# Patient Record
Sex: Male | Born: 1990 | Race: White | Hispanic: No | Marital: Single | Smoking: Never smoker
Health system: Southern US, Community
[De-identification: ages and names within clinical notes are randomized; demographics above are authoritative.]

## PROBLEM LIST (undated history)

## (undated) DIAGNOSIS — Z789 Other specified health status: Secondary | ICD-10-CM

## (undated) HISTORY — PX: WISDOM TOOTH EXTRACTION: SHX21

---
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
## 2015-04-25 ENCOUNTER — Other Ambulatory Visit: Payer: Self-pay

## 2015-04-25 ENCOUNTER — Ambulatory Visit: Payer: Managed Care, Other (non HMO)

## 2015-04-25 ENCOUNTER — Encounter: Payer: Self-pay | Admitting: Emergency Medicine

## 2015-04-25 ENCOUNTER — Ambulatory Visit
Admission: EM | Admit: 2015-04-25 | Discharge: 2015-04-25 | Disposition: A | Discharge status: Home / Self Care | Payer: Managed Care, Other (non HMO) | Attending: Internal Medicine | Admitting: Internal Medicine

## 2015-04-25 DIAGNOSIS — R079 Chest pain, unspecified: Secondary | ICD-10-CM | POA: Diagnosis present

## 2015-04-25 DIAGNOSIS — R0602 Shortness of breath: Secondary | ICD-10-CM | POA: Diagnosis not present

## 2015-04-25 DIAGNOSIS — R0789 Other chest pain: Secondary | ICD-10-CM | POA: Diagnosis not present

## 2015-04-25 DIAGNOSIS — M545 Low back pain: Secondary | ICD-10-CM | POA: Insufficient documentation

## 2015-04-25 MED ORDER — ALBUTEROL SULFATE HFA 108 (90 BASE) MCG/ACT IN AERS: 1.0000 | INHALATION_SPRAY | Freq: Four times a day (QID) | RESPIRATORY_TRACT | Status: AC | PRN

## 2015-04-25 MED ORDER — OMEPRAZOLE 20 MG PO CPDR: 40.0000 mg | DELAYED_RELEASE_CAPSULE | Freq: Every day | ORAL | Status: AC

## 2015-04-25 MED ORDER — IPRATROPIUM-ALBUTEROL 0.5-2.5 (3) MG/3ML IN SOLN
3.0000 mL | Freq: Once | RESPIRATORY_TRACT | Status: AC
  Administered 2015-04-25: 3 mL via RESPIRATORY_TRACT

## 2015-04-25 NOTE — Discharge Instructions (Signed)
Bronchospasm is sometimes caused by excess stomach acid.  Try medication for stomach acid for 2 weeks, to see if improvement occurs.  Go to ER if severe tightness/difficulty breathing/speaking occur. A primary care provider can help determine if additional testing/treatments are needed, if chest tightness/discomfort persist.  Bronchospasm A bronchospasm is when the tubes that carry air in and out of your lungs (airways) spasm or tighten. During a bronchospasm it is hard to breathe. This is because the airways get smaller. A bronchospasm can be triggered by:  Allergies. These may be to animals, pollen, food, or mold.  Infection. This is a common cause of bronchospasm.  Exercise.  Irritants. These include pollution, cigarette smoke, strong odors, aerosol sprays, and paint fumes.  Weather changes.  Stress.  Being emotional. HOME CARE   Always have a plan for getting help. Know when to call your doctor and local emergency services (911 in the [place] Know where you can get emergency care.  Only take medicines as told by your doctor.  If you were prescribed an inhaler or nebulizer machine, ask your doctor how to use it correctly. Always use a spacer with your inhaler if you were given one.  Stay calm during an attack. Try to relax and breathe more slowly.  Control your home environment:  Change your heating and air conditioning filter at least once a month.  Limit your use of fireplaces and wood stoves.  Do not  smoke. Do not  allow smoking in your home.  Avoid perfumes and fragrances.  Get rid of pests (such as roaches and mice) and their droppings.  Throw away plants if you see mold on them.  Keep your house clean and dust free.  Replace carpet with wood, tile, or vinyl flooring. Carpet can trap dander and dust.  Use allergy-proof pillows, mattress covers, and box spring covers.  Wash bed sheets and blankets every week in hot water. Dry them in a dryer.  Use blankets  that are made of polyester or cotton.  Wash hands frequently. GET HELP IF:  You have muscle aches.  You have chest pain.  The thick spit you spit or cough up (sputum) changes from clear or white to yellow, green, gray, or bloody.  The thick spit you spit or cough up gets thicker.  There are problems that may be related to the medicine you are given such as:  A rash.  Itching.  Swelling.  Trouble breathing. GET HELP RIGHT AWAY IF:  You feel you cannot breathe or catch your breath.  You cannot stop coughing.  Your treatment is not helping you breathe better.  You have very bad chest pain. MAKE SURE YOU:   Understand these instructions.  Will watch your condition.  Will get help right away if you are not doing well or get worse. Document Released: 09/13/2009 Document Revised: 11/21/2013 Document Reviewed: 05/09/2013 [facility] Patient Information 2015 [employer], [place]. This information is not intended to replace advice given to you by your health care provider. Make sure you discuss any questions you have with your health care provider.

## 2015-04-25 NOTE — ED Notes (Signed)
Chest pain X 3 weeks. He feels sharp pains that last about 30 seconds, but then relieves into tightness in his chest. Reports a lot of pain with running yesterday and SOB. No personal heart or lung issues.

## 2015-04-25 NOTE — ED Provider Notes (Signed)
CSN: [id number]     Arrival date & time 04/25/15  1431 History   None    Chief Complaint  Patient presents with  . Chest Pain   HPI  Chest pain X 3 weeks. He feels sharp pains that last about 30 seconds, but then relieves into tightness in his chest. Reports a lot of pain with running yesterday and SOB. No personal heart or lung issues. Non smoker. No leg pain/swelling, not on hormones, no known family hx DVT. 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  Mild intermittent nausea, about 3x/week, denies frank heartburn/indigestion.  Has the sharp chest pains 2-3x/day, then underlying chest tightness most of the time.  No cough, no fever.  No wheezing, no hx asthma. No joint pains, no rash, no weight loss.  Little bit of low back pain.     History reviewed. No pertinent past medical history. Past Surgical History  Procedure Laterality Date  . Wisdom tooth extraction     No family history on file. History  Substance Use Topics  . Smoking status: Never Smoker   . Smokeless tobacco: Current User  . Alcohol Use: Yes     Comment: Rare- 1 drink/month    Review of Systems  All other systems reviewed and are negative.   Allergies  Review of patient's allergies indicates no known allergies.  Home Medications   Prior to Admission medications   Not on File   BP 110/74 mmHg  Pulse 85  Temp(Src) 98.6 F (37 C) (Oral)  Resp 16  Ht  (1.778 m)  Wt 144 lb (65.318 kg)  BMI 20.66 kg/m2  SpO2 99% Physical Exam  Constitutional: He is oriented to person, place, and time. No distress.  Alert, nicely groomed  HENT:  Head: Atraumatic.  Eyes:  Conjugate gaze, no eye redness/drainage  Neck: Neck supple.  Cardiovascular: Normal rate and regular rhythm.  Exam reveals no gallop.   No murmur heard. Pulmonary/Chest: No respiratory distress. He has no wheezes. He has no rales. He exhibits no tenderness.  Lungs clear, symmetric quiet breath sounds  Abdominal: Soft.  He exhibits no distension. There is no tenderness. There is no rebound and no guarding.  Musculoskeletal: Normal range of motion.  Neurological: He is alert and oriented to person, place, and time.  Skin: Skin is warm and dry.  No cyanosis  Nursing note and vitals reviewed.   ED Course  Procedures (including critical care time) Labs Review Labs Reviewed - No data to display    ED ECG REPORT   Date: 04/25/2015  EKG Time: 4:42 PM  Rate: 85      Rhythm: normal EKG, normal sinus rhythm Axis: 90, unremarkable  No acute ST or T wave changes. I agree with computer interpretation of NSR, but find that "rightward axis" is nonsignificant.    Imaging Review Dg Chest 2 View  04/25/2015   CLINICAL DATA:  Chest pain for 3 weeks, short of breath with exercise  EXAM: CHEST  2 VIEW  COMPARISON:  Chest x-ray of 06/28/2007  FINDINGS: The lungs are clear and minimally hyperaerated. Mediastinal and hilar contours are unremarkable. The heart is within normal limits in size. No bony abnormality is seen.  IMPRESSION: Slight hyper aeration.  No active lung disease.   Electronically Signed   By: Dwyane Dee M.D.   On: 04/25/2015 16:10    Pt given duoneb breathing tx in ED for chest tightness, with improvement.    MDM  1. Chest tightness   Suspect bronchospasm due to new exercise (running), possible contribution from mild reflux. Trial of prilosec x 2 wks, prn albuterol inhaler. Recheck or FU with a PCP (given Dr Plonk's office number) for persistent sx's, if not improving in a week or two.       Eustace MooreLaura W Dannisha Eckmann, MD 04/25/15 [phone]

## 2016-05-30 IMAGING — CR DG CHEST 2V
2 series · 2 of 2 positions shown · non-contrast
Comparison: Chest x-ray of 06/28/2007

CLINICAL DATA: Chest pain for 3 weeks, short of breath with
exercise

EXAM:
CHEST  2 VIEW

[chest pa]
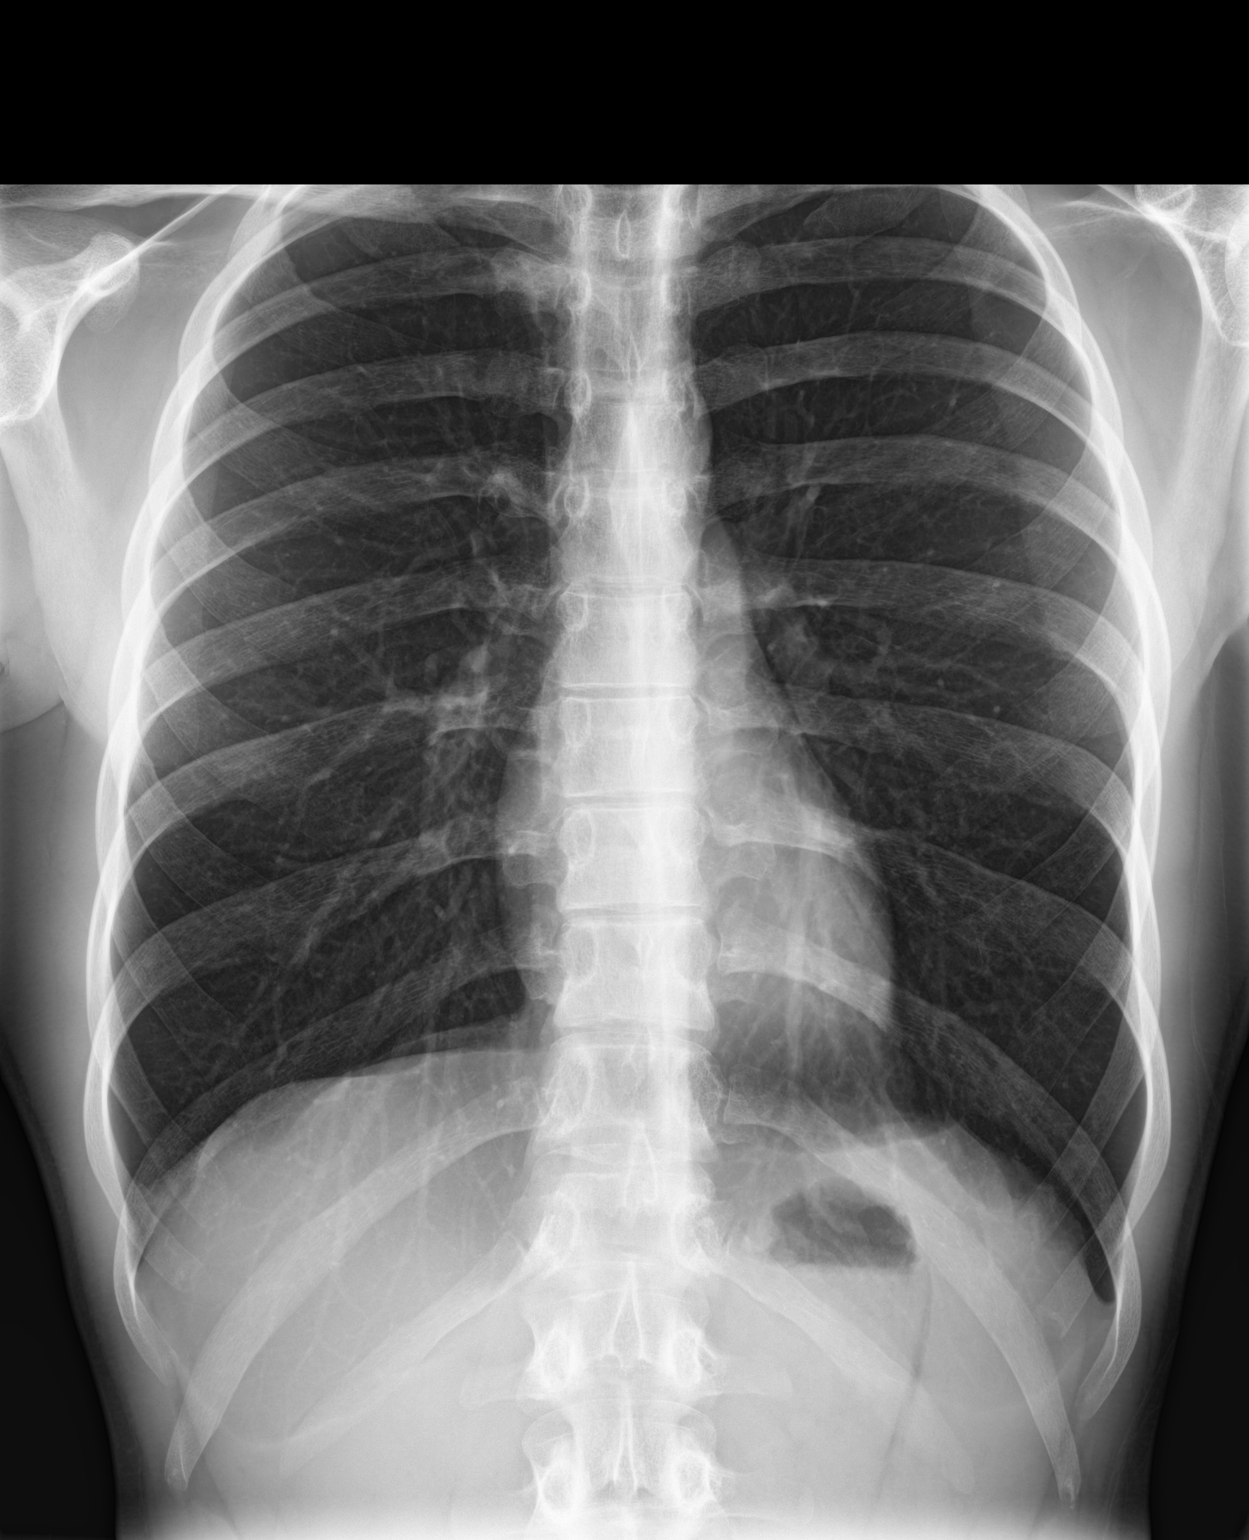

[chest lat]
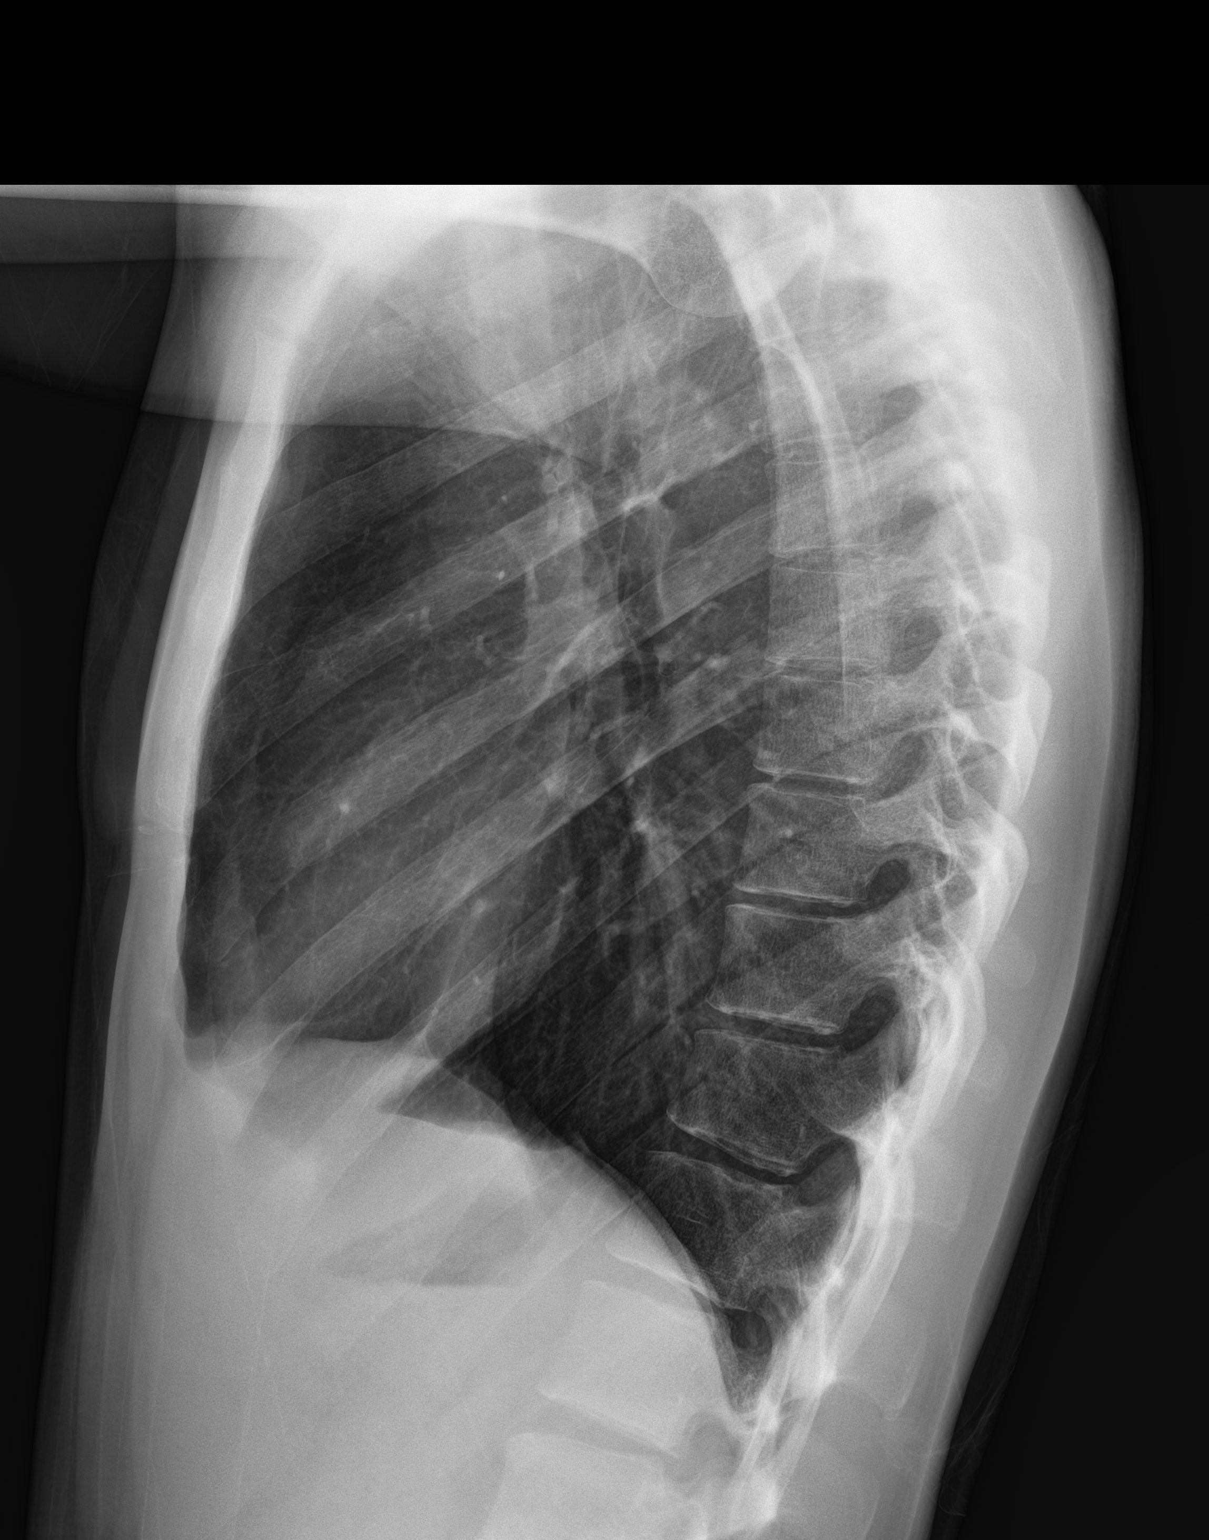

[2 of 2 positions shown; findings below may reference images not displayed]

FINDINGS: The lungs are clear and minimally hyperaerated. Mediastinal and
hilar contours are unremarkable. The heart is within normal limits
in size. No bony abnormality is seen.
IMPRESSION: Slight hyper aeration.  No active lung disease.

## 2016-07-21 NOTE — Discharge Instructions (Signed)
[facility] °[facility] °ENDOSCOPIC SINUS SURGERY °[facility] ° °What is Functional Endoscopic Sinus Surgery? ° The Surgery involves making the natural openings of the sinuses larger by removing the bony partitions that separate the sinuses from the nasal cavity.  The natural sinus lining is preserved as much as possible to allow the sinuses to resume normal function after the surgery.  In some patients nasal polyps (excessively swollen lining of the sinuses) may be removed to relieve obstruction of the sinus openings.  The surgery is performed through the nose using lighted scopes, which eliminates the need for incisions on the face.  A septoplasty is a different procedure which is sometimes performed with sinus surgery.  It involves straightening the boy partition that separates the two sides of your nose.  A crooked or deviated septum may need repair if is obstructing the sinuses or nasal airflow.  Turbinate reduction is also often performed during sinus surgery.  The turbinates are bony proturberances from the side walls of the nose which swell and can obstruct the nose in patients with sinus and allergy problems.  Their size can be surgically reduced to help relieve nasal obstruction. ° °What Can Sinus Surgery Do For Me? ° Sinus surgery can reduce the frequency of sinus infections requiring antibiotic treatment.  This can provide improvement in nasal congestion, post-nasal drainage, facial pressure and nasal obstruction.  Surgery will NOT prevent you from ever having an infection again, so it usually only for patients who get infections 4 or more times yearly requiring antibiotics, or for infections that do not clear with antibiotics.  It will not cure nasal allergies, so patients with allergies may still require medication to treat their allergies after surgery. Surgery may improve headaches related to sinusitis, however, some people will continue to  require medication to control sinus headaches related to allergies.  Surgery will do nothing for other forms of headache (migraine, tension or cluster). ° °What Are the Risks of Endoscopic Sinus Surgery? ° Current techniques allow surgery to be performed safely with little risk, however, there are rare complications that patients should be aware of.  Because the sinuses are located around the eyes, there is risk of eye injury, including blindness, though again, this would be quite rare. This is usually a result of bleeding behind the eye during surgery, which puts the vision oat risk, though there are treatments to protect the vision and prevent permanent disrupted by surgery causing a leak of the spinal fluid that surrounds the brain.  More serious complications would include bleeding inside the brain cavity or damage to the brain.  Again, all of these complications are uncommon, and spinal fluid leaks can be safely managed surgically if they occur.  The most common complication of sinus surgery is bleeding from the nose, which may require packing or cauterization of the nose.  Continued sinus have polyps may experience recurrence of the polyps requiring revision surgery.  Alterations of sense of smell or injury to the tear ducts are also rare complications.  ° °What is the Surgery Like, and what is the Recovery? ° The Surgery usually takes a couple of hours to perform, and is usually performed under a general anesthetic (completely asleep).  Patients are usually discharged home after a couple of hours.  Sometimes during surgery it is necessary to pack the nose to control bleeding, and the packing is left in place for 24 - 48 hours, and removed by your surgeon.    If a septoplasty was performed during the procedure, there is often a splint placed which must be removed after 5-7 days.   °Discomfort: Pain is usually mild to moderate, and can be controlled by prescription pain medication or acetaminophen (Tylenol).   Aspirin, Ibuprofen (Advil, Motrin), or Naprosyn (Aleve) should be avoided, as they can cause increased bleeding.  Most patients feel sinus pressure like they have a bad head cold for several days.  Sleeping with your head elevated can help reduce swelling and facial pressure, as can ice packs over the face.  A humidifier may be helpful to keep the mucous and blood from drying in the nose.  ° °Diet: There are no specific diet restrictions, however, you should generally start with clear liquids and a light diet of bland foods because the anesthetic can cause some nausea.  Advance your diet depending on how your stomach feels.  Taking your pain medication with food will often help reduce stomach upset which pain medications can cause. ° °Nasal Saline Irrigation: It is important to remove blood clots and dried mucous from the nose as it is healing.  This is done by having you irrigate the nose at least 3 - 4 times daily with a salt water solution.  We recommend using NeilMed Sinus Rinse (available at the drug store).  Fill the squeeze bottle with the solution, bend over a sink, and insert the tip of the squeeze bottle into the nose ½ of an inch.  Point the tip of the squeeze bottle towards the inside corner of the eye on the same side your irrigating.  Squeeze the bottle and gently irrigate the nose.  If you bend forward as you do this, most of the fluid will flow back out of the nose, instead of down your throat.   The solution should be warm, near body temperature, when you irrigate.   Each time you irrigate, you should use a full squeeze bottle.  ° °Note that if you are instructed to use Nasal Steroid Sprays at any time after your surgery, irrigate with saline BEFORE using the steroid spray, so you do not wash it all out of the nose. °Another product, Nasal Saline Gel (such as AYR Nasal Saline Gel) can be applied in each nostril 3 - 4 times daily to moisture the nose and reduce scabbing or crusting. ° °Bleeding:   Bloody drainage from the nose can be expected for several days, and patients are instructed to irrigate their nose frequently with salt water to help remove mucous and blood clots.  The drainage may be dark red or brown, though some fresh blood may be seen intermittently, especially after irrigation.  Do not blow you nose, as bleeding may occur. If you must sneeze, keep your mouth open to allow air to escape through your mouth. ° °If heavy bleeding occurs: Irrigate the nose with saline to rinse out clots, then spray the nose 3 - 4 times with Afrin Nasal Decongestant Spray.  The spray will constrict the blood vessels to slow bleeding.  Pinch the lower half of your nose shut to apply pressure, and lay down with your head elevated.  Ice packs over the nose may help as well. If bleeding persists despite these measures, you should notify your doctor.  Do not use the Afrin routinely to control nasal congestion after surgery, as it can result in worsening congestion and may affect healing.  ° ° ° °Activity: Return to work varies among patients. Most patients will be   out of work at least 5 - 7 days to recover.  Patient may return to work after they are off of narcotic pain medication, and feeling well enough to perform the functions of their job.  Patients must avoid heavy lifting (over 10 pounds) or strenuous physical for 2 weeks after surgery, so your employer may need to assign you to light duty, or keep you out of work longer if light duty is not possible.  NOTE: you should not drive, operate dangerous machinery, do any mentally demanding tasks or make any important legal or financial decisions while on narcotic pain medication and recovering from the general anesthetic.  °  °Call Your Doctor Immediately if You Have Any of the Following: °1. Bleeding that you cannot control with the above measures °2. Loss of vision, double vision, bulging of the eye or black eyes. °3. Fever over 101 degrees °4. Neck stiffness with  severe headache, fever, nausea and change in mental state. °You are always encourage to call anytime with concerns, however, please call with requests for pain medication refills during office hours. ° °Office Endoscopy: During follow-up visits your doctor will remove any packing or splints that may have been placed and evaluate and clean your sinuses endoscopically.  Topical anesthetic will be used to make this as comfortable as possible, though you may want to take your pain medication prior to the visit.  How often this will need to be done varies from patient to patient.  After complete recovery from the surgery, you may need follow-up endoscopy from time to time, particularly if there is concern of recurrent infection or nasal polyps. ° °General Anesthesia, Adult, Care After °Refer to this sheet in the next few weeks. These instructions provide you with information on caring for yourself after your procedure. Your health care provider may also give you more specific instructions. Your treatment has been planned according to current medical practices, but problems sometimes occur. Call your health care provider if you have any problems or questions after your procedure. °WHAT TO EXPECT AFTER THE PROCEDURE °After the procedure, it is typical to experience: °· Sleepiness. °· Nausea and vomiting. °HOME CARE INSTRUCTIONS °· For the first 24 hours after general anesthesia: °¨ Have a responsible person with you. °¨ Do not drive a car. If you are alone, do not take public transportation. °¨ Do not drink alcohol. °¨ Do not take medicine that has not been prescribed by your health care provider. °¨ Do not sign important papers or make important decisions. °¨ You may resume a normal diet and activities as directed by your health care provider. °· Change bandages (dressings) as directed. °· If you have questions or problems that seem related to general anesthesia, call the hospital and ask for the anesthetist or  anesthesiologist on call. °SEEK MEDICAL CARE IF: °· You have nausea and vomiting that continue the day after anesthesia. °· You develop a rash. °SEEK IMMEDIATE MEDICAL CARE IF:  °· You have difficulty breathing. °· You have chest pain. °· You have any allergic problems. °  °This information is not intended to replace advice given to you by your health care provider. Make sure you discuss any questions you have with your health care provider. °  °Document Released: 02/22/2001 Document Revised: 12/07/2014 Document Reviewed: 03/16/2012 °Elsevier Interactive Patient Education ©2016 [employer] ° °

## 2016-07-23 ENCOUNTER — Encounter
Admission: RE | Disposition: A | Discharge status: Home / Self Care | Payer: Self-pay | Source: Ambulatory Visit | Attending: Otolaryngology

## 2016-07-23 ENCOUNTER — Ambulatory Visit: Payer: Managed Care, Other (non HMO) | Admitting: Anesthesiology

## 2016-07-23 ENCOUNTER — Ambulatory Visit
Admission: RE | Admit: 2016-07-23 | Discharge: 2016-07-23 | Disposition: A | Discharge status: Home / Self Care | Payer: Managed Care, Other (non HMO) | Source: Ambulatory Visit | Attending: Otolaryngology | Admitting: Otolaryngology

## 2016-07-23 DIAGNOSIS — Z8349 Family history of other endocrine, nutritional and metabolic diseases: Secondary | ICD-10-CM | POA: Diagnosis not present

## 2016-07-23 DIAGNOSIS — J342 Deviated nasal septum: Secondary | ICD-10-CM | POA: Insufficient documentation

## 2016-07-23 DIAGNOSIS — J343 Hypertrophy of nasal turbinates: Secondary | ICD-10-CM | POA: Insufficient documentation

## 2016-07-23 DIAGNOSIS — J3489 Other specified disorders of nose and nasal sinuses: Secondary | ICD-10-CM | POA: Insufficient documentation

## 2016-07-23 DIAGNOSIS — Z9889 Other specified postprocedural states: Secondary | ICD-10-CM | POA: Insufficient documentation

## 2016-07-23 HISTORY — DX: Other specified health status: Z78.9

## 2016-07-23 HISTORY — PX: NASAL TURBINATE REDUCTION: SHX2072

## 2016-07-23 HISTORY — PX: SEPTOPLASTY: SHX2393

## 2016-07-23 SURGERY — SEPTOPLASTY, NOSE
Anesthesia: General | Laterality: Bilateral | Wound class: Clean Contaminated

## 2016-07-23 MED ORDER — DEXAMETHASONE SODIUM PHOSPHATE 4 MG/ML IJ SOLN
INTRAMUSCULAR | Status: DC | PRN
  Administered 2016-07-23: 10 mg via INTRAVENOUS

## 2016-07-23 MED ORDER — LIDOCAINE-EPINEPHRINE 1 %-1:100000 IJ SOLN
INTRAMUSCULAR | Status: DC | PRN
  Administered 2016-07-23: 7.5 mL

## 2016-07-23 MED ORDER — GLYCOPYRROLATE 0.2 MG/ML IJ SOLN
INTRAMUSCULAR | Status: DC | PRN
  Administered 2016-07-23: 0.6 mg via INTRAVENOUS

## 2016-07-23 MED ORDER — OXYCODONE HCL 5 MG PO TABS: 5.0000 mg | ORAL_TABLET | Freq: Once | ORAL | Status: DC | PRN

## 2016-07-23 MED ORDER — NEOSTIGMINE METHYLSULFATE 10 MG/10ML IV SOLN
INTRAVENOUS | Status: DC | PRN
  Administered 2016-07-23: 3 mg via INTRAVENOUS

## 2016-07-23 MED ORDER — PROPOFOL 10 MG/ML IV BOLUS
INTRAVENOUS | Status: DC | PRN
  Administered 2016-07-23: 50 mg via INTRAVENOUS

## 2016-07-23 MED ORDER — PROMETHAZINE HCL 25 MG/ML IJ SOLN: 6.2500 mg | INTRAMUSCULAR | Status: DC | PRN

## 2016-07-23 MED ORDER — SUCCINYLCHOLINE CHLORIDE 20 MG/ML IJ SOLN
INTRAMUSCULAR | Status: DC | PRN
  Administered 2016-07-23: 100 mg via INTRAVENOUS

## 2016-07-23 MED ORDER — CEFAZOLIN SODIUM-DEXTROSE 2-4 GM/100ML-% IV SOLN
2.0000 g | Freq: Once | INTRAVENOUS | Status: AC
  Administered 2016-07-23: 2 g via INTRAVENOUS

## 2016-07-23 MED ORDER — OXYCODONE HCL 5 MG/5ML PO SOLN: 5.0000 mg | Freq: Once | ORAL | Status: DC | PRN

## 2016-07-23 MED ORDER — ACETAMINOPHEN 10 MG/ML IV SOLN
INTRAVENOUS | Status: DC | PRN
  Administered 2016-07-23: 1000 mg via INTRAVENOUS

## 2016-07-23 MED ORDER — FENTANYL CITRATE (PF) 100 MCG/2ML IJ SOLN
25.0000 ug | INTRAMUSCULAR | Status: DC | PRN
  Administered 2016-07-23: 25 ug via INTRAVENOUS

## 2016-07-23 MED ORDER — FENTANYL CITRATE (PF) 100 MCG/2ML IJ SOLN
INTRAMUSCULAR | Status: DC | PRN
Start: 2016-07-23 — End: 2016-07-23
  Administered 2016-07-23: 25 ug via INTRAVENOUS

## 2016-07-23 MED ORDER — LACTATED RINGERS IV SOLN
INTRAVENOUS | Status: DC | PRN
  Administered 2016-07-23: 07:00:00 via INTRAVENOUS

## 2016-07-23 MED ORDER — PHENYLEPHRINE HCL 0.5 % NA SOLN
NASAL | Status: DC | PRN
  Administered 2016-07-23: 10 mL via TOPICAL

## 2016-07-23 MED ORDER — MIDAZOLAM HCL 5 MG/5ML IJ SOLN
INTRAMUSCULAR | Status: DC | PRN
  Administered 2016-07-23: 2 mg via INTRAVENOUS

## 2016-07-23 MED ORDER — ONDANSETRON HCL 4 MG/2ML IJ SOLN
INTRAMUSCULAR | Status: DC | PRN
  Administered 2016-07-23: 4 mg via INTRAVENOUS

## 2016-07-23 MED ORDER — ROCURONIUM BROMIDE 100 MG/10ML IV SOLN
INTRAVENOUS | Status: DC | PRN
  Administered 2016-07-23: 30 mg via INTRAVENOUS

## 2016-07-23 MED ORDER — OXYMETAZOLINE HCL 0.05 % NA SOLN
2.0000 | Freq: Two times a day (BID) | NASAL | Status: DC
  Administered 2016-07-23: 2 via NASAL

## 2016-07-23 MED ORDER — LIDOCAINE HCL (CARDIAC) 20 MG/ML IV SOLN
INTRAVENOUS | Status: DC | PRN
  Administered 2016-07-23: 40 mg via INTRAVENOUS

## 2016-07-23 SURGICAL SUPPLY — 42 items
AQUAPLAST 3X3 FLAT (MISCELLANEOUS) ×3
BLADE SURG 15 STRL LF DISP TIS (BLADE) IMPLANT
BLADE SURG 15 STRL SS (BLADE)
CANISTER SUCT 1200ML W/VALVE (MISCELLANEOUS) ×3 IMPLANT
CATH IV 18X1 1/4 SAFELET (CATHETERS) IMPLANT
CLOSURE WOUND 1/2 X4 (GAUZE/BANDAGES/DRESSINGS) ×1
CLOSURE WOUND 1/4X4 (GAUZE/BANDAGES/DRESSINGS) ×1
COAG SUCT 10F 3.5MM HAND CTRL (MISCELLANEOUS) IMPLANT
COAGULATOR SUCT 8FR VV (MISCELLANEOUS) ×3 IMPLANT
DRAPE HEAD BAR (DRAPES) ×3 IMPLANT
DRESSING NASL FOAM PST OP SINU (MISCELLANEOUS) IMPLANT
DRSG NASAL FOAM POST OP SINU (MISCELLANEOUS)
GLOVE PI ULTRA LF STRL 7.5 (GLOVE) ×2 IMPLANT
GLOVE PI ULTRA NON LATEX 7.5 (GLOVE) ×4
IV CATH 18X1 1/4 SAFELET (CATHETERS)
KIT ROOM TURNOVER OR (KITS) ×3 IMPLANT
NEEDLE HYPO 25GX1X1/2 BEV (NEEDLE) ×3 IMPLANT
NEEDLE SPNL 25GX3.5 QUINCKE BL (NEEDLE) IMPLANT
NS IRRIG 500ML POUR BTL (IV SOLUTION) ×3 IMPLANT
PACK DRAPE NASAL/ENT (PACKS) ×3 IMPLANT
PACKING NASAL EPIS 4X2.4 XEROG (MISCELLANEOUS) IMPLANT
PAD GROUND ADULT SPLIT (MISCELLANEOUS) ×3 IMPLANT
PATTIES SURGICAL .5 X3 (DISPOSABLE) ×3 IMPLANT
SOL ANTI-FOG 6CC FOG-OUT (MISCELLANEOUS) ×1 IMPLANT
SOL FOG-OUT ANTI-FOG 6CC (MISCELLANEOUS) ×2
SPLINT AQUAPLAST 3X3 FLAT (MISCELLANEOUS) ×1 IMPLANT
SPLINT NASAL SEPTAL BLV .50 ST (MISCELLANEOUS) ×3 IMPLANT
STRAP BODY AND KNEE 60X3 (MISCELLANEOUS) ×6 IMPLANT
STRIP CLOSURE SKIN 1/2X4 (GAUZE/BANDAGES/DRESSINGS) ×2 IMPLANT
STRIP CLOSURE SKIN 1/4X4 (GAUZE/BANDAGES/DRESSINGS) ×2 IMPLANT
SUT CHROMIC 3-0 (SUTURE) ×2
SUT CHROMIC 3-0 KS 27XMFL CR (SUTURE) ×1
SUT CHROMIC 5 0 P 3 (SUTURE) ×3 IMPLANT
SUT ETHILON 3-0 KS 30 BLK (SUTURE) ×3 IMPLANT
SUT ETHILON 4-0 (SUTURE)
SUT ETHILON 4-0 FS2 18XMFL BLK (SUTURE)
SUT PLAIN GUT 4-0 (SUTURE) ×3 IMPLANT
SUTURE CHRMC 3-0 KS 27XMFL CR (SUTURE) ×1 IMPLANT
SUTURE ETHLN 4-0 FS2 18XMF BLK (SUTURE) IMPLANT
SYR 3ML LL SCALE MARK (SYRINGE) ×3 IMPLANT
TOWEL OR 17X26 4PK STRL BLUE (TOWEL DISPOSABLE) ×3 IMPLANT
WATER STERILE IRR 500ML POUR (IV SOLUTION) ×3 IMPLANT

## 2016-07-23 NOTE — H&P (Signed)
  H&P has been reviewed and no changes necessary. To be downloaded later. 

## 2016-07-23 NOTE — Progress Notes (Signed)
Pt exhibited high sensitivity to 2 mg of Midazolam as premedication.  Very drowsy upon entering the OR.  This was the only medication given at that point.  Surgery took place uneventfully.  Pt required lower doses of fentanyl during surgery and in PACU.  Pt is now awake, smiling and has minimal pain.  Pt's fiance was told that he had significant drowsiness from the premedication.  Pt is appropriate for discharge from ambulatory center.

## 2016-07-23 NOTE — Anesthesia Preprocedure Evaluation (Signed)
Anesthesia Evaluation  Patient identified by MRN, date of birth, ID band Patient awake    Reviewed: Allergy & Precautions, NPO status , Patient's Chart, lab work & pertinent test results  Airway Mallampati: II  TM Distance: >3 FB Neck ROM: Full    Dental no notable dental hx.    Pulmonary neg pulmonary ROS,    Pulmonary exam normal breath sounds clear to auscultation       Cardiovascular negative cardio ROS Normal cardiovascular exam Rhythm:Regular Rate:Normal     Neuro/Psych negative neurological ROS  negative psych ROS   GI/Hepatic negative GI ROS, Neg liver ROS,   Endo/Other  negative endocrine ROS  Renal/GU negative Renal ROS  negative genitourinary   Musculoskeletal negative musculoskeletal ROS (+)   Abdominal   Peds negative pediatric ROS (+)  Hematology negative hematology ROS (+)   Anesthesia Other Findings   Reproductive/Obstetrics negative OB ROS                             Anesthesia Physical Anesthesia Plan  ASA: I  Anesthesia Plan: General   Post-op Pain Management:    Induction: Intravenous  Airway Management Planned: Oral ETT  Additional Equipment:   Intra-op Plan:   Post-operative Plan: Extubation in OR  Informed Consent: I have reviewed the patients History and Physical, chart, labs and discussed the procedure including the risks, benefits and alternatives for the proposed anesthesia with the patient or authorized representative who has indicated his/her understanding and acceptance.   Dental advisory given  Plan Discussed with: CRNA  Anesthesia Plan Comments:         Anesthesia Quick Evaluation  

## 2016-07-23 NOTE — Op Note (Signed)
07/23/2016  10:02 AM    Warren Allen, Warren  [id number]   Pre-Op Dx:  Deviated nose and septum, nasal obstruction, turbinate hypertrophy  Post-op Dx: Deviated nose and septum, nasal obstruction, turbinate hypertrophy  Proc: Septorhinoplasty, bilateral inferior turbinate partial reduction   Surg:  Warren Allen  Anes:  GOT  EBL:  150 mL  Comp:  None  Findings:  Nasal tip pulled way off to the right and nasal bones pushing to the right side. Quadrangular plate and ethmoid plate buckled to the left with overhanging cartilage in the right inferior nasal passage. Right inferior turbinate larger than the left. The inferior anterior nasal spine was to the right of midline  Procedure: The patient was given general anesthesia by oral endotracheal intubation. He was very drug nave and did not require much medication to get a full effect. Once general anesthesia was completed he was placed in supine position and the nose prepped using 7 mL of 1% Xylocaine with epi 100,000 for infiltration the nasal septum and lateral nasal walls. This was also used for infiltration in the nasal dorsum and external sides the nose. Cotton pledgets soaked in phenylephrine and Xylocaine were then placed inside the nasal cavity on both sides. He was prepped and draped in a sterile fashion.  Cotton pledges were removed and a 0 scope was used to visualize the nasal passage and both sides. The septum was bowed towards the left side significantly with the inferior portion overhanging the right maxillary crest. There is no sign of infection in either side. The inferior turbinates were enlarged, right side been larger because of the septal deviation. A left hemitransfixion incision was created with elevation of mucoperichondrium on both sides of the quadrangular plate. It was anchored to the anterior nasal spine inferiorly which was to the right side of midline. It was freed up here and a small portion of the inferior edge of the  quadrangular plate was trimmed to remove the overhanging cartilage and allow the columella to swing back towards the midline. The bony cartilaginous junction was split and the mucoperichondrium was elevated on both sides the ethmoid and vomer. The ethmoid plate was markedly buckled towards the left side and some the anterior portion of this was fractured and removed. Most of the vomer posteriorly was relatively straight and left intact. A portion maxillary crest was overhanging on the right side was trimmed which left a little bit of a mucosal tear here on the right side. Once the crooked cartilage and bone had been removed and the rest push back to the midline, a 40 plain gut suture on a Keith needle was used to close the mucosal layers back to the midline. This was used to close the hemitransfixion incision as well. This is also used to close the small tear of the mucosa inferiorly along the maxillary crest on the right side. A 3-0 chromic was used to through and through fashion to hold the anterior inferior cartilage edge in the midline which is just to the left of the inferior nasal spine.  Intercartilaginous incisions were then created to elevate the skin over the nasal dorsum. This was done with the blunt scissors. Ball tunnels were then created both sides lateral to the nasal bone where the incision lines would be created in the bone. Skin was elevated this area straight guarded chisels were used to create medial osteotomies sides the the rough in this area and a small file was used to smooth this out. Curved guarded chisels  were then used to create low lateral osteotomies bilaterally. the left side was done first bone was freed up area and the right side was then done and also freed up and care was taken to make sure the bones were completely fractured. This move the bones back towards the midline where they had been push towards the right side. He is now help hold the tip back to the middle and where was  anchored in the midline it now looked to be straight in the middle of the face. Digital pressure was placed on both sides the bone to help prevent bleeding and oozing in this area.  Inside the nose the intercartilaginous incisions were closed with a 5-0 chromic suture hold this together loosely. The inferior turbinates were visualize the right side was larger. There is a small tear in its anterior border from the lateral osteotomy. Some of the tissue here was trimmed and electrocautery was used to cauterize some of the very redundant swollen tissue. The posterior turbinate was then outfractured help reduce a better airway. Was repeated on the left side although not as much tissue was removed on the left side this turbinate wasn't quite as large. The airways were opened bilaterally and the septum was in the midline. Septal splints were placed using Xomed 0.5 mm regular sized splints that were trimmed and placed on both sides of the septum. These were held in position with a 3-0 nylon through and through suture.  There is no significant bruising around the eyes or other nasal dorsum. Digital pressure was held here again to help controlling bleeding. The nose was washed and then the washed again with some alcohol pads. Steri-Strips were then placed over the nasal dorsum using 1 half-inch strips and quarter-inch strips.  Aquaplast was then fashioned to fit the nose and put in hot water to soften it. It was then placed over the Steri-Strips to hold the nasal bones in position. Some tape was placed over the top of this help hold it there.  Patient was awakened taken to the recovery room in satisfactory condition. Suction was used for cleaning out blood in the nose. He did do some oozing when he woke up but this was all suctioned clear. The cast was clean and dry and the nose appeared to be in the midline. There were no operative complications.  Dispo:   To PACU to be discharged home  Plan:  To follow-up in the  office in 6 days for catheter removal and splint removal. He'll rest at home with his head elevated for the next 3 days. We'll use prednisone help control swelling and Keflex to prevent infection. He has Norco 5/325mg   to use for pain but he will be instructed to use a minimum amount because of his sensitivity to medications. He'll call the office if he has any problems and we'll see him sooner if necessary  Warren Allen  07/23/2016 10:02 AM

## 2016-07-23 NOTE — Anesthesia Procedure Notes (Signed)
Procedure Name: Intubation Date/Time: 07/23/2016 8:11 AM Performed by: Andee PolesBUSH, Mischelle Reeg Pre-anesthesia Checklist: Patient identified, Emergency Drugs available, Suction available, Patient being monitored and Timeout performed Patient Re-evaluated:Patient Re-evaluated prior to inductionOxygen Delivery Method: Circle system utilized Preoxygenation: Pre-oxygenation with 100% oxygen Intubation Type: IV induction Ventilation: Mask ventilation without difficulty Laryngoscope Size: Mac and 3 Grade View: Grade I Tube type: Oral Rae Tube size: 7.5 mm Number of attempts: 1 Placement Confirmation: ETT inserted through vocal cords under direct vision,  positive ETCO2 and breath sounds checked- equal and bilateral Tube secured with: Tape Dental Injury: Teeth and Oropharynx as per pre-operative assessment

## 2016-07-23 NOTE — Anesthesia Postprocedure Evaluation (Signed)
Anesthesia Post Note  Patient: Warren Allen  Procedure(s) Performed: Procedure(s) (LRB): SEPTOPLASTY (Bilateral) TURBINATE REDUCTION/SUBMUCOSAL RESECTION (Bilateral)  Patient location during evaluation: PACU Anesthesia Type: General Level of consciousness: awake and alert Pain management: pain level controlled Vital Signs Assessment: post-procedure vital signs reviewed and stable Respiratory status: spontaneous breathing, nonlabored ventilation, respiratory function stable and patient connected to nasal cannula oxygen Cardiovascular status: blood pressure returned to baseline and stable Postop Assessment: no signs of nausea or vomiting Anesthetic complications: no    Makensey Rego C

## 2016-07-23 NOTE — Transfer of Care (Signed)
Immediate Anesthesia Transfer of Care Note  Patient: Warren Allen  Procedure(s) Performed: Procedure(s): SEPTOPLASTY (Bilateral) TURBINATE REDUCTION/SUBMUCOSAL RESECTION (Bilateral)  Patient Location: PACU  Anesthesia Type: General  Level of Consciousness: awake, alert  and patient cooperative  Airway and Oxygen Therapy: Patient Spontanous Breathing and Patient connected to supplemental oxygen  Post-op Assessment: Post-op Vital signs reviewed, Patient's Cardiovascular Status Stable, Respiratory Function Stable, Patent Airway and No signs of Nausea or vomiting  Post-op Vital Signs: Reviewed and stable  Complications: No apparent anesthesia complications

## 2016-07-24 ENCOUNTER — Encounter: Payer: Self-pay | Admitting: Otolaryngology
